# Patient Record
Sex: Female | Born: 1975 | Race: Black or African American | Hispanic: No | Marital: Single
Health system: Southern US, Community
[De-identification: ages and names within clinical notes are randomized; demographics above are authoritative.]

---

## 2021-08-25 ENCOUNTER — Other Ambulatory Visit: Payer: Self-pay | Admitting: Family Medicine

## 2021-08-25 DIAGNOSIS — Z1239 Encounter for other screening for malignant neoplasm of breast: Secondary | ICD-10-CM

## 2021-09-11 ENCOUNTER — Ambulatory Visit
Admission: RE | Admit: 2021-09-11 | Discharge: 2021-09-11 | Disposition: A | Discharge status: Home / Self Care | Payer: No Typology Code available for payment source | Source: Ambulatory Visit | Attending: Family Medicine | Admitting: Family Medicine

## 2021-09-11 ENCOUNTER — Ambulatory Visit: Payer: Self-pay

## 2021-09-11 DIAGNOSIS — Z1239 Encounter for other screening for malignant neoplasm of breast: Secondary | ICD-10-CM

## 2022-03-04 ENCOUNTER — Other Ambulatory Visit: Payer: Self-pay | Admitting: Orthopedic Surgery

## 2022-03-04 DIAGNOSIS — M259 Joint disorder, unspecified: Secondary | ICD-10-CM

## 2022-04-02 ENCOUNTER — Ambulatory Visit
Admission: RE | Admit: 2022-04-02 | Discharge: 2022-04-02 | Disposition: A | Discharge status: Home / Self Care | Payer: No Typology Code available for payment source | Source: Ambulatory Visit | Attending: Orthopedic Surgery | Admitting: Orthopedic Surgery

## 2022-04-02 DIAGNOSIS — M259 Joint disorder, unspecified: Secondary | ICD-10-CM

## 2022-06-11 ENCOUNTER — Emergency Department (HOSPITAL_COMMUNITY)
Admission: EM | Admit: 2022-06-11 | Discharge: 2022-06-11 | Discharge status: Against Medical Advice | Payer: No Typology Code available for payment source | Attending: Emergency Medicine | Admitting: Emergency Medicine

## 2022-06-11 ENCOUNTER — Encounter (HOSPITAL_COMMUNITY): Payer: Self-pay

## 2022-06-11 DIAGNOSIS — R42 Dizziness and giddiness: Secondary | ICD-10-CM | POA: Diagnosis present

## 2022-06-11 DIAGNOSIS — Z5321 Procedure and treatment not carried out due to patient leaving prior to being seen by health care provider: Secondary | ICD-10-CM | POA: Insufficient documentation

## 2022-06-11 LAB — COMPREHENSIVE METABOLIC PANEL
ALT: 12 U/L (ref 0–44)
AST: 15 U/L (ref 15–41)
Albumin: 4.2 g/dL (ref 3.5–5.0)
Alkaline Phosphatase: 49 U/L (ref 38–126)
Anion gap: 5 (ref 5–15)
BUN: 8 mg/dL (ref 6–20)
CO2: 24 mmol/L (ref 22–32)
Calcium: 9.1 mg/dL (ref 8.9–10.3)
Chloride: 109 mmol/L (ref 98–111)
Creatinine, Ser: 1.06 mg/dL — ABNORMAL HIGH (ref 0.44–1.00)
GFR, Estimated: >60 mL/min (ref >60)
Glucose, Bld: 99 mg/dL (ref 70–99)
Potassium: 3.5 mmol/L (ref 3.5–5.1)
Sodium: 138 mmol/L (ref 135–145)
Total Bilirubin: 0.7 mg/dL (ref 0.3–1.2)
Total Protein: 7.2 g/dL (ref 6.5–8.1)

## 2022-06-11 LAB — CBC WITH DIFFERENTIAL/PLATELET
Abs Immature Granulocytes: 0.01 10*3/uL (ref 0.00–0.07)
Basophils Absolute: 0.1 10*3/uL (ref 0.0–0.1)
Basophils Relative: 1 %
Eosinophils Absolute: 0.1 10*3/uL (ref 0.0–0.5)
Eosinophils Relative: 1 %
HCT: 40 % (ref 36.0–46.0)
Hemoglobin: 13.4 g/dL (ref 12.0–15.0)
Immature Granulocytes: 0 %
Lymphocytes Relative: 41 %
Lymphs Abs: 2.3 10*3/uL (ref 0.7–4.0)
MCH: 28.5 pg (ref 26.0–34.0)
MCHC: 33.5 g/dL (ref 30.0–36.0)
MCV: 85.1 fL (ref 80.0–100.0)
Monocytes Absolute: 0.3 10*3/uL (ref 0.1–1.0)
Monocytes Relative: 6 %
Neutro Abs: 2.9 10*3/uL (ref 1.7–7.7)
Neutrophils Relative %: 51 %
Platelets: 207 10*3/uL (ref 150–400)
RBC: 4.7 MIL/uL (ref 3.87–5.11)
RDW: 13.2 % (ref 11.5–15.5)
WBC: 5.6 10*3/uL (ref 4.0–10.5)
nRBC: 0 % (ref 0.0–0.2)

## 2022-06-11 LAB — TROPONIN I (HIGH SENSITIVITY): Troponin I (High Sensitivity): 3 ng/L (ref <18)

## 2022-06-11 MED ORDER — MECLIZINE HCL 25 MG PO TABS
25.0000 mg | ORAL_TABLET | Freq: Once | ORAL | Status: AC
  Administered 2022-06-11: 25 mg via ORAL
  Filled 2022-06-11: qty 1

## 2022-06-11 NOTE — ED Provider Triage Note (Signed)
Emergency Medicine Provider Triage Evaluation Note  Brandy Lawrence , a 46 y.o. female  was evaluated in triage.  Pt complains of dizziness, blurred vision, palpitations, gait abnormalities.  Patient states that over the past 3 to 4 weeks, she is experienced episodes of dizziness described as room spinning type sensation.  She states that episodes are exacerbated by quick movements of the head.  She reports feelings of blurred vision as well as feeling unbalanced during episodes of dizziness.  Episodes are cute onset and relieved with time.  Patient is currently not endorsing symptoms.  She reports feeling palpitations after episodes of dizziness with no associated shortness of breath or chest pain.  Patient also reports diarrhea intermittently over the past month..  Review of Systems  Positive: Above Negative:   Physical Exam  BP 132/89 (BP Location: Left Arm)   Pulse 69   Temp 98.7 F (37.1 C) (Oral)   Resp 18   LMP  (LMP Unknown)   SpO2 100%  Gen:   Awake, no distress   Resp:  Normal effort  MSK:   Moves extremities without difficulty  Other:   Medical Decision Making  Medically screening exam initiated at 1:05 PM.  Appropriate orders placed.  Rola R Hardge was informed that the remainder of the evaluation will be completed by another provider, this initial triage assessment does not replace that evaluation, and the importance of remaining in the ED until their evaluation is complete.     Wilnette Kales, [place] 06/11/22 1306

## 2022-07-08 IMAGING — MG MM DIGITAL SCREENING BILAT W/ TOMO AND CAD
8 series · 8 of 24 positions shown · non-contrast
Comparison: None.

CLINICAL DATA: Screening.

EXAM:
DIGITAL SCREENING BILATERAL MAMMOGRAM WITH TOMOSYNTHESIS AND CAD
TECHNIQUE: Bilateral screening digital craniocaudal and mediolateral oblique
mammograms were obtained. Bilateral screening digital breast
tomosynthesis was performed. The images were evaluated with
computer-aided detection.

[L CC synth-2D]
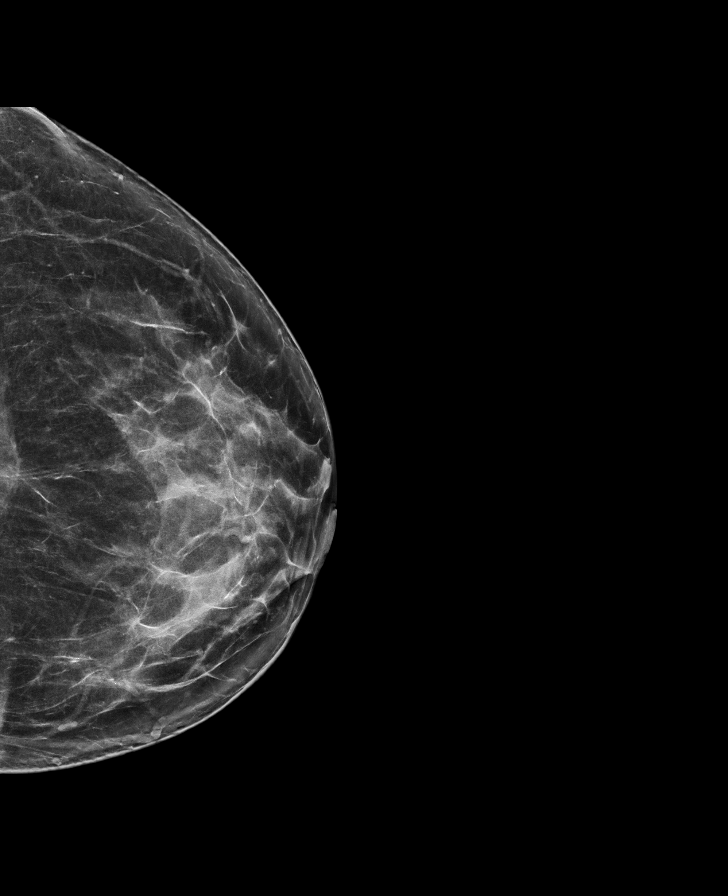

[R CC synth-2D]
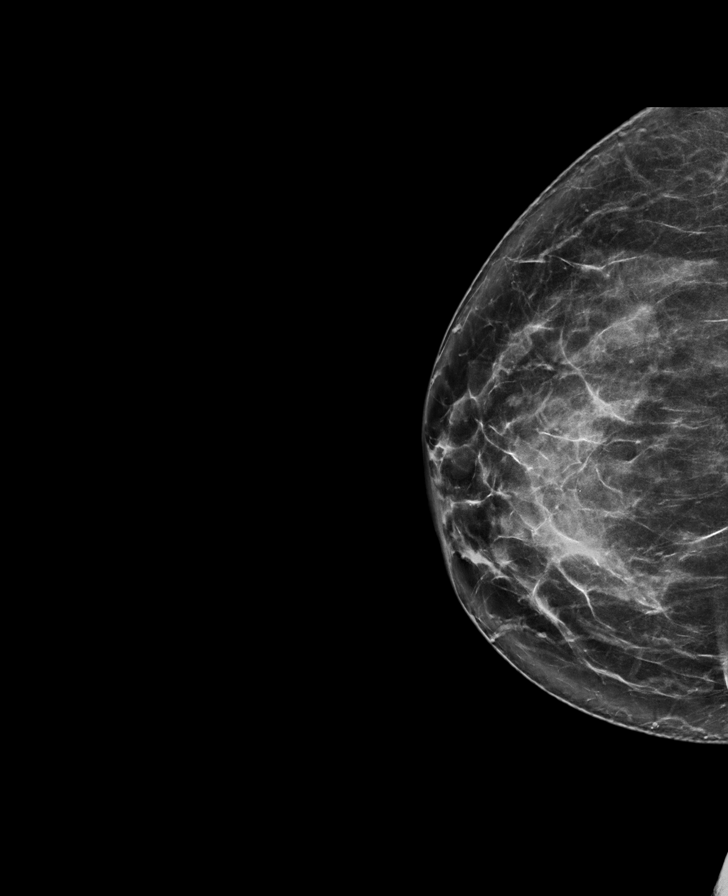

[R MLO synth-2D]
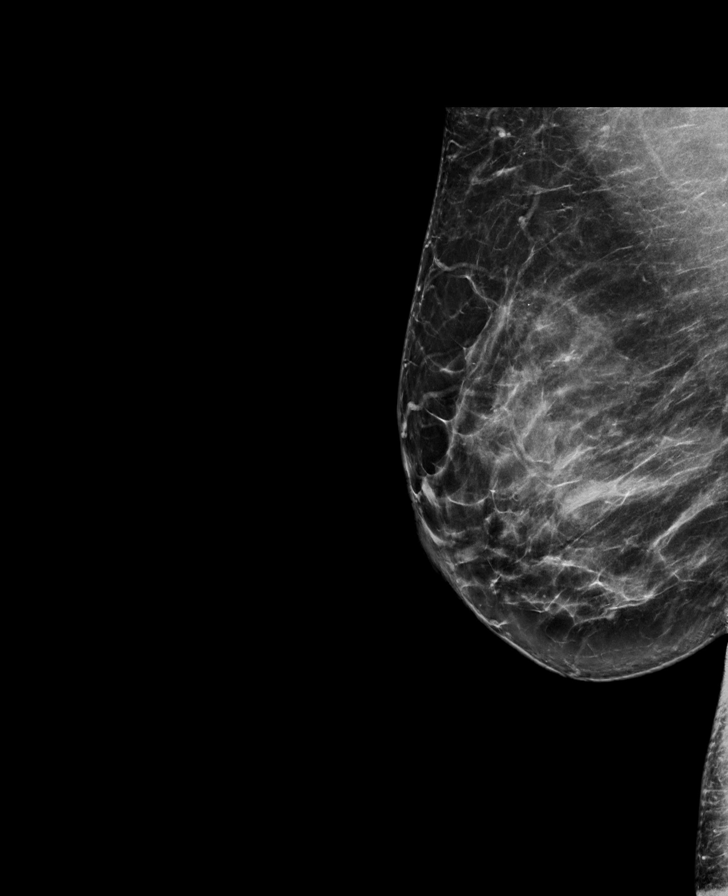

[L MLO synth-2D]
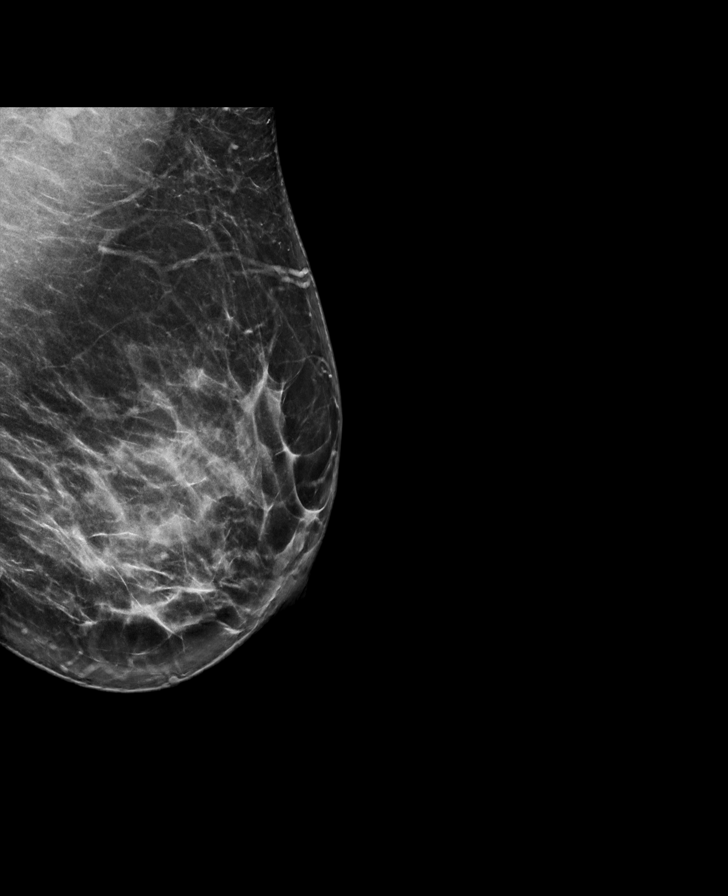

[L MLO tomo · tomo slice 39/78.0]
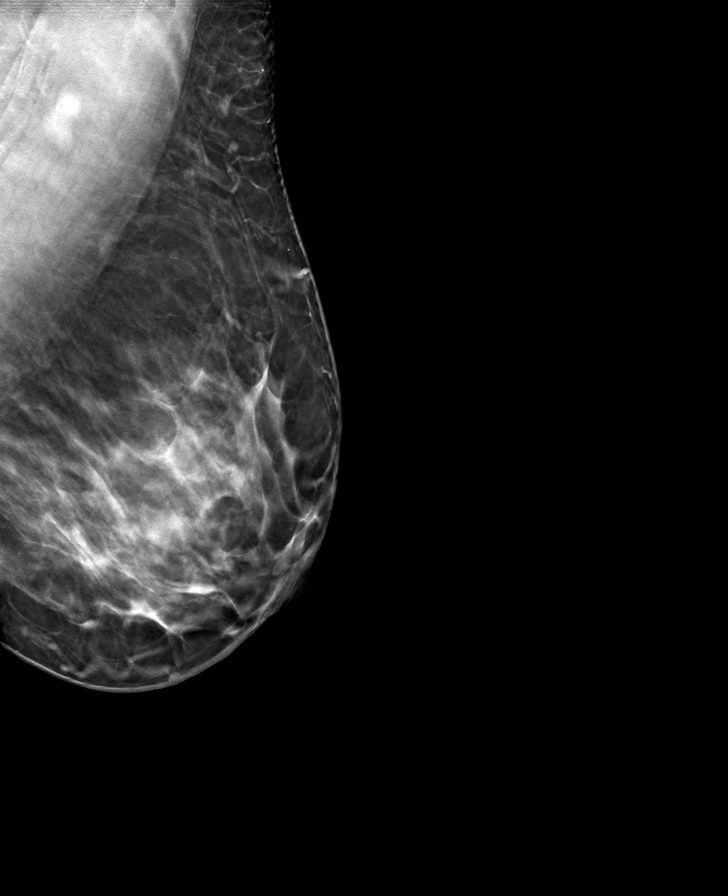

[R MLO tomo · tomo slice 41/80.0]
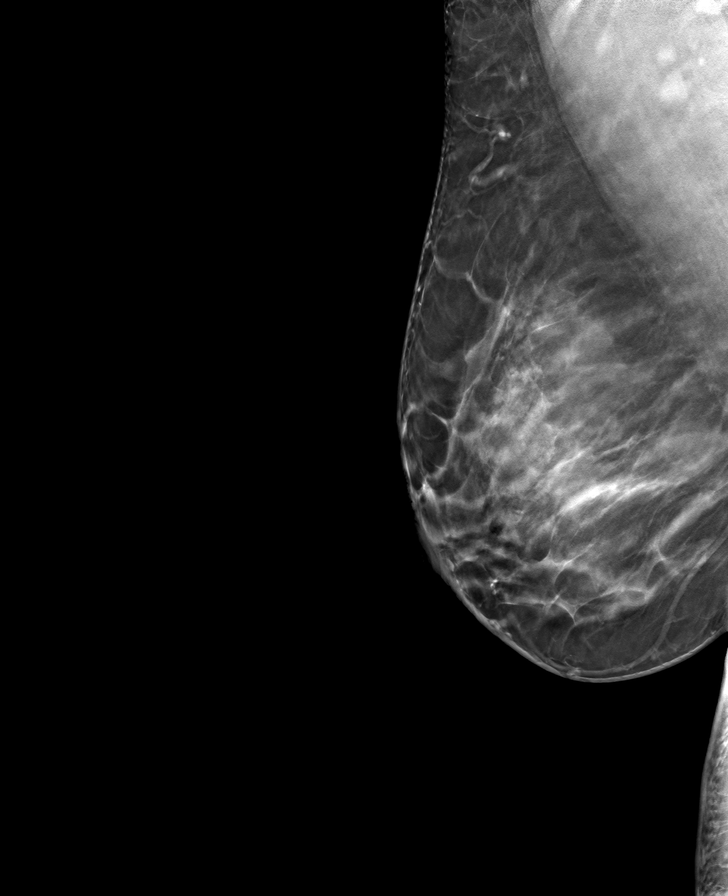

[L CC tomo · tomo slice 37/72.0]
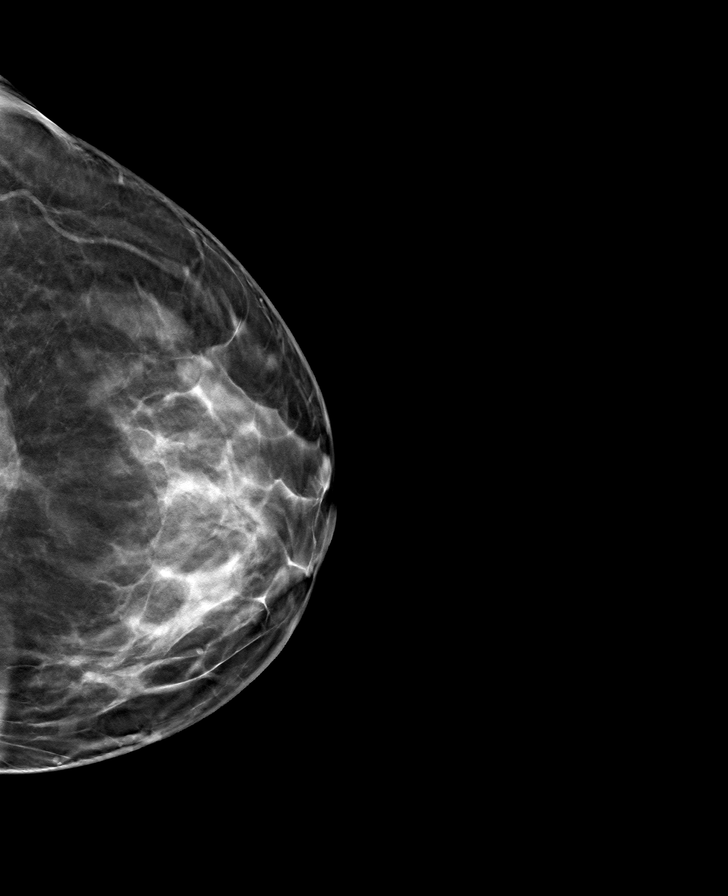

[R CC tomo · tomo slice 39/78.0]
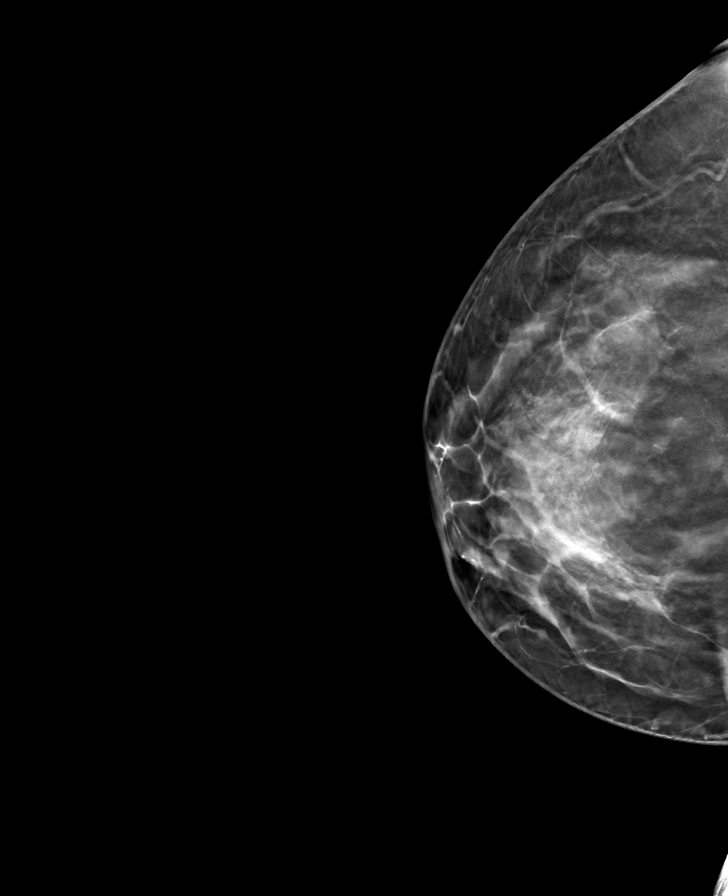

[8 of 24 positions shown; findings below may reference images not displayed]

ACR Breast Density Category c: The breast tissue is heterogeneously
dense, which may obscure small masses
FINDINGS: There are no findings suspicious for malignancy.
IMPRESSION: No mammographic evidence of malignancy. A result letter of this
screening mammogram will be mailed directly to the patient.

RECOMMENDATION:
Screening mammogram in one year. (Code:C8-T-HNK)

BI-RADS CATEGORY  1: Negative.
